# Patient Record
Sex: Female | Born: 1955 | Race: White | Hispanic: No | Smoking: Never smoker
Health system: Southern US, Community
[De-identification: ages and names within clinical notes are randomized; demographics above are authoritative.]

## PROBLEM LIST (undated history)

## (undated) DIAGNOSIS — M1990 Unspecified osteoarthritis, unspecified site: Secondary | ICD-10-CM

## (undated) DIAGNOSIS — E119 Type 2 diabetes mellitus without complications: Secondary | ICD-10-CM

---

## 1994-02-07 HISTORY — PX: BREAST BIOPSY: SHX20

## 2008-02-13 ENCOUNTER — Ambulatory Visit: Payer: Self-pay

## 2009-12-15 ENCOUNTER — Ambulatory Visit: Payer: Self-pay

## 2010-02-09 ENCOUNTER — Ambulatory Visit: Payer: Self-pay | Admitting: Nurse Practitioner

## 2010-03-02 ENCOUNTER — Ambulatory Visit: Payer: Self-pay | Admitting: Rheumatology

## 2010-03-10 ENCOUNTER — Ambulatory Visit: Payer: Self-pay | Admitting: Nurse Practitioner

## 2010-04-08 ENCOUNTER — Ambulatory Visit: Payer: Self-pay | Admitting: Nurse Practitioner

## 2011-01-10 ENCOUNTER — Ambulatory Visit: Payer: Self-pay

## 2012-01-30 ENCOUNTER — Ambulatory Visit: Payer: Self-pay

## 2013-01-30 ENCOUNTER — Ambulatory Visit: Payer: Self-pay

## 2014-02-14 ENCOUNTER — Ambulatory Visit: Payer: Self-pay

## 2014-03-04 ENCOUNTER — Ambulatory Visit: Payer: Self-pay

## 2015-01-27 ENCOUNTER — Other Ambulatory Visit: Payer: Self-pay | Admitting: Obstetrics and Gynecology

## 2015-01-27 DIAGNOSIS — Z1231 Encounter for screening mammogram for malignant neoplasm of breast: Secondary | ICD-10-CM

## 2015-03-06 ENCOUNTER — Ambulatory Visit
Admission: RE | Admit: 2015-03-06 | Discharge: 2015-03-06 | Disposition: A | Discharge status: Home / Self Care | Payer: BC Managed Care – PPO | Source: Ambulatory Visit | Attending: Obstetrics and Gynecology | Admitting: Obstetrics and Gynecology

## 2015-03-06 DIAGNOSIS — Z1231 Encounter for screening mammogram for malignant neoplasm of breast: Secondary | ICD-10-CM | POA: Insufficient documentation

## 2019-06-12 ENCOUNTER — Other Ambulatory Visit: Payer: Self-pay | Admitting: Family Medicine

## 2019-06-12 DIAGNOSIS — Z1231 Encounter for screening mammogram for malignant neoplasm of breast: Secondary | ICD-10-CM

## 2020-01-06 ENCOUNTER — Other Ambulatory Visit: Payer: Self-pay

## 2020-01-06 ENCOUNTER — Ambulatory Visit
Admission: RE | Admit: 2020-01-06 | Discharge: 2020-01-06 | Disposition: A | Discharge status: Home / Self Care | Payer: BC Managed Care – PPO | Source: Ambulatory Visit | Attending: Family Medicine | Admitting: Family Medicine

## 2020-01-06 DIAGNOSIS — Z1231 Encounter for screening mammogram for malignant neoplasm of breast: Secondary | ICD-10-CM | POA: Insufficient documentation

## 2020-02-27 ENCOUNTER — Other Ambulatory Visit: Payer: Self-pay

## 2020-02-27 ENCOUNTER — Encounter: Payer: Self-pay | Admitting: Emergency Medicine

## 2020-02-27 DIAGNOSIS — R55 Syncope and collapse: Secondary | ICD-10-CM | POA: Insufficient documentation

## 2020-02-27 DIAGNOSIS — E119 Type 2 diabetes mellitus without complications: Secondary | ICD-10-CM | POA: Diagnosis not present

## 2020-02-27 DIAGNOSIS — R519 Headache, unspecified: Secondary | ICD-10-CM | POA: Insufficient documentation

## 2020-02-27 LAB — CBC WITH DIFFERENTIAL/PLATELET
Abs Immature Granulocytes: 0.03 10*3/uL (ref 0.00–0.07)
Basophils Absolute: 0 10*3/uL (ref 0.0–0.1)
Basophils Relative: 0 %
Eosinophils Absolute: 0 10*3/uL (ref 0.0–0.5)
Eosinophils Relative: 1 %
HCT: 34.2 % — ABNORMAL LOW (ref 36.0–46.0)
Hemoglobin: 11.1 g/dL — ABNORMAL LOW (ref 12.0–15.0)
Immature Granulocytes: 0 %
Lymphocytes Relative: 8 %
Lymphs Abs: 0.7 10*3/uL (ref 0.7–4.0)
MCH: 33.3 pg (ref 26.0–34.0)
MCHC: 32.5 g/dL (ref 30.0–36.0)
MCV: 102.7 fL — ABNORMAL HIGH (ref 80.0–100.0)
Monocytes Absolute: 1 10*3/uL (ref 0.1–1.0)
Monocytes Relative: 11 %
Neutro Abs: 7 10*3/uL (ref 1.7–7.7)
Neutrophils Relative %: 80 %
Platelets: 150 10*3/uL (ref 150–400)
RBC: 3.33 MIL/uL — ABNORMAL LOW (ref 3.87–5.11)
RDW: 12 % (ref 11.5–15.5)
WBC: 8.8 10*3/uL (ref 4.0–10.5)
nRBC: 0 % (ref 0.0–0.2)

## 2020-02-27 NOTE — ED Triage Notes (Addendum)
EMS brings pt in from local business where she had syncopal episode x2; denies any precipitating factors; denies any recent illness; denies hx of same; denies any c/o at present

## 2020-02-28 ENCOUNTER — Emergency Department
Admission: EM | Admit: 2020-02-28 | Discharge: 2020-02-28 | Disposition: A | Discharge status: Home / Self Care | Payer: BC Managed Care – PPO | Attending: Emergency Medicine | Admitting: Emergency Medicine

## 2020-02-28 DIAGNOSIS — R55 Syncope and collapse: Secondary | ICD-10-CM

## 2020-02-28 HISTORY — DX: Type 2 diabetes mellitus without complications: E11.9

## 2020-02-28 HISTORY — DX: Unspecified osteoarthritis, unspecified site: M19.90

## 2020-02-28 LAB — COMPREHENSIVE METABOLIC PANEL
ALT: 16 U/L (ref 0–44)
AST: 22 U/L (ref 15–41)
Albumin: 3.7 g/dL (ref 3.5–5.0)
Alkaline Phosphatase: 54 U/L (ref 38–126)
Anion gap: 11 (ref 5–15)
BUN: 25 mg/dL — ABNORMAL HIGH (ref 8–23)
CO2: 25 mmol/L (ref 22–32)
Calcium: 8.8 mg/dL — ABNORMAL LOW (ref 8.9–10.3)
Chloride: 103 mmol/L (ref 98–111)
Creatinine, Ser: 1.09 mg/dL — ABNORMAL HIGH (ref 0.44–1.00)
GFR, Estimated: 57 mL/min — ABNORMAL LOW (ref >60)
Glucose, Bld: 119 mg/dL — ABNORMAL HIGH (ref 70–99)
Potassium: 3.2 mmol/L — ABNORMAL LOW (ref 3.5–5.1)
Sodium: 139 mmol/L (ref 135–145)
Total Bilirubin: 1.2 mg/dL (ref 0.3–1.2)
Total Protein: 7.2 g/dL (ref 6.5–8.1)

## 2020-02-28 LAB — TROPONIN I (HIGH SENSITIVITY)
Troponin I (High Sensitivity): 3 ng/L (ref <18)
Troponin I (High Sensitivity): 4 ng/L (ref <18)

## 2020-02-28 NOTE — Discharge Instructions (Addendum)
Return to the ER for new, worsening or recurrent passing out, lightheadedness, weakness, chest pain, severe headache, or any other new or worsening symptoms that concern you.

## 2020-02-28 NOTE — ED Provider Notes (Signed)
[facility] Emergency Department Provider Note ____________________________________________   Event Date/Time   First MD Initiated Contact with Patient 02/28/20 0725     (approximate)  I have reviewed the triage vital signs and the nursing notes.   HISTORY  Chief Complaint Loss of Consciousness    HPI Annette Cortez is a 65 y.o. female with PMH as noted below including diabetes on metformin who presents with a syncopal episode last night around 11 PM.  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  She states that she had not eaten anything all day, and in fact had not eaten anything since the day before.  She states that she felt hungry and a bit weak.  She started to have a mild headache.  She then lost consciousness.  Since she has regained consciousness in the hospital waiting overnight, she has not had any further symptoms.  She states she is still hungry but otherwise feels fine.  She denies any recent prior similar episodes.    Past Medical History:  Diagnosis Date  . Arthritis   . Diabetes mellitus without complication (HCC)     There are no problems to display for this patient.   Past Surgical History:  Procedure Laterality Date  . BREAST BIOPSY Right 1996   Negative    Prior to Admission medications   Not on File    Allergies Patient has no known allergies.  Family History  Problem Relation Age of Onset  . Breast cancer Paternal Aunt 62  . Breast cancer Paternal Grandmother 45    Social History Social History   Tobacco Use  . Smoking status: Never Smoker  . Smokeless tobacco: Never Used  Vaping Use  . Vaping Use: Never used  Substance Use Topics  . Alcohol use: Never  . Drug use: Never    Review of Systems  Constitutional: No fever/chills. Eyes: No visual changes. ENT: No sore throat. Cardiovascular: Denies chest pain. Respiratory: Denies shortness of  breath. Gastrointestinal: No vomiting or diarrhea.  Genitourinary: Negative for dysuria.  Musculoskeletal: Negative for back pain. Skin: Negative for rash. Neurological: Negative for headache.   ____________________________________________   PHYSICAL EXAM:  VITAL SIGNS: ED Triage Vitals  Enc Vitals Group     BP 02/27/20 2337 (!) 114/46     Pulse Rate 02/27/20 2337 72     Resp 02/27/20 2337 18     Temp 02/27/20 2337 97.8 F (36.6 C)     Temp Source 02/27/20 2337 Oral     SpO2 02/27/20 2337 95 %     Weight 02/27/20 2337 185 lb (83.9 kg)     Height 02/27/20 2337 5' 6.5" (1.689 m)     Head Circumference --      Peak Flow --      Pain Score 02/27/20 2343 0     Pain Loc --      Pain Edu? --      Excl. in GC? --     Constitutional: Alert and oriented. Well appearing and in no acute distress. Eyes: Conjunctivae are normal.  Head: Atraumatic. Nose: No congestion/rhinnorhea. Mouth/Throat: Mucous membranes are slightly dry.   Neck: Normal range of motion.  Cardiovascular: Normal rate, regular rhythm. Grossly normal heart sounds.  Good peripheral circulation. Respiratory: Normal respiratory effort.  No retractions. Lungs CTAB. Gastrointestinal: No distention.  Musculoskeletal: Extremities warm and well perfused.  Neurologic:  Normal speech and language. No gross focal  neurologic deficits are appreciated.  Skin:  Skin is warm and dry. No rash noted. Psychiatric: Mood and affect are normal. Speech and behavior are normal.  ____________________________________________   LABS (all labs ordered are listed, but only abnormal results are displayed)  Labs Reviewed  CBC WITH DIFFERENTIAL/PLATELET - Abnormal; Notable for the following components:      Result Value   RBC 3.33 (*)    Hemoglobin 11.1 (*)    HCT 34.2 (*)    MCV 102.7 (*)    All other components within normal limits  COMPREHENSIVE METABOLIC PANEL - Abnormal; Notable for the following components:   Potassium 3.2 (*)     Glucose, Bld 119 (*)    BUN 25 (*)    Creatinine, Ser 1.09 (*)    Calcium 8.8 (*)    GFR, Estimated 57 (*)    All other components within normal limits  TROPONIN I (HIGH SENSITIVITY)  TROPONIN I (HIGH SENSITIVITY)   ____________________________________________  EKG  ED ECG REPORT I, Dionne Bucy, the attending physician, personally viewed and interpreted this ECG.  Date: 02/28/2020 EKG Time: 2346 Rate: 68 Rhythm: normal sinus rhythm QRS Axis: normal Intervals: normal ST/T Wave abnormalities: normal Narrative Interpretation: no evidence of acute ischemia  ____________________________________________  RADIOLOGY    ____________________________________________   PROCEDURES  Procedure(s) performed: No  Procedures  Critical Care performed: No ____________________________________________   INITIAL IMPRESSION / ASSESSMENT AND PLAN / ED COURSE  Pertinent labs & imaging results that were available during my care of the patient were reviewed by me and considered in my medical decision making (see chart for details).  65 year old female with diabetes on metformin presents with an apparent syncopal episode last night after not having eaten all day.  The patient had a mild prodrome and has had no significant symptoms after the event.  The patient reports that EMS checked her glucose and it was around 100.  Of note, the patient had to wait in the waiting room for almost 8 hours prior to being placed in exam room and seen.  She had no recurrent symptoms during this time period.  Lab work-up including troponins x2 was negative for acute findings.  Overall presentation is consistent with vasovagal near syncope versus a possible episode of hypoglycemia especially given that the patient had not eaten since the day prior.  There is no evidence of arrhythmia or other cardiac cause, PE, acute stroke, or other concerning etiology of this episode.  Given that she has now been  in the ED for 8 hours with no recurrent symptoms, stable vital signs, and a reassuring work-up, there is no indication for further ED work-up or admission.  The patient herself would like to go home.  I counseled her on the results of the work-up.  Return precautions given, and she expresses understanding.  ____________________________________________   FINAL CLINICAL IMPRESSION(S) / ED DIAGNOSES  Final diagnoses:  Syncope, unspecified syncope type      NEW MEDICATIONS STARTED DURING THIS VISIT:  There are no discharge medications for this patient.    Note:  This document was prepared using Dragon voice recognition software and may include unintentional dictation errors.   Dionne Bucy, MD 02/28/20 1511

## 2021-12-10 IMAGING — MG DIGITAL SCREENING BILAT W/ TOMO W/ CAD
8 series · 8 of 24 positions shown · non-contrast
Comparison: Previous exam(s).

CLINICAL DATA: Screening.

EXAM:
DIGITAL SCREENING BILATERAL MAMMOGRAM WITH TOMO AND CAD

[R MLO synth-2D]
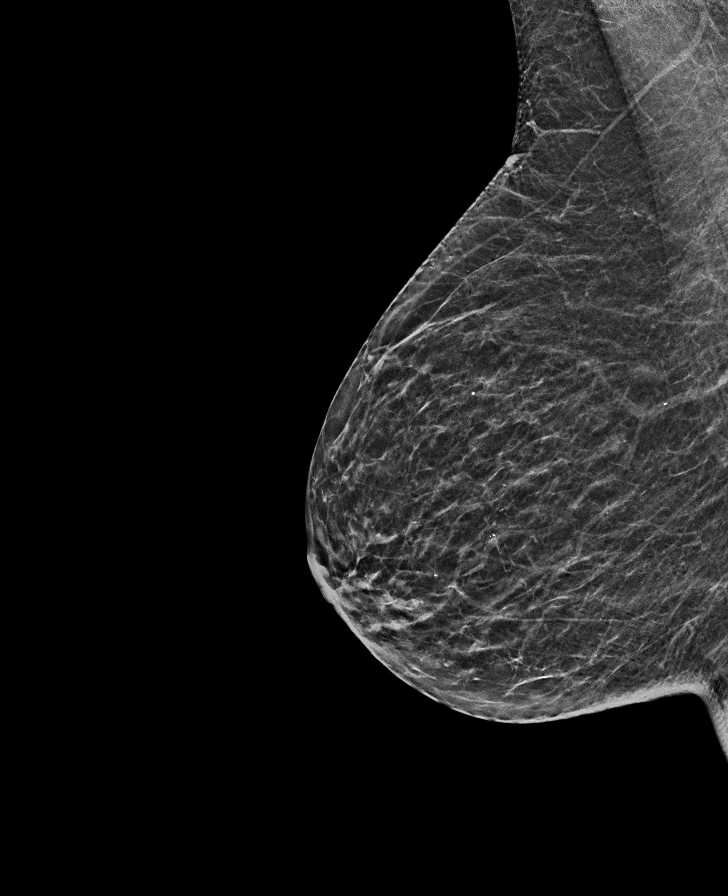

[L CC synth-2D]
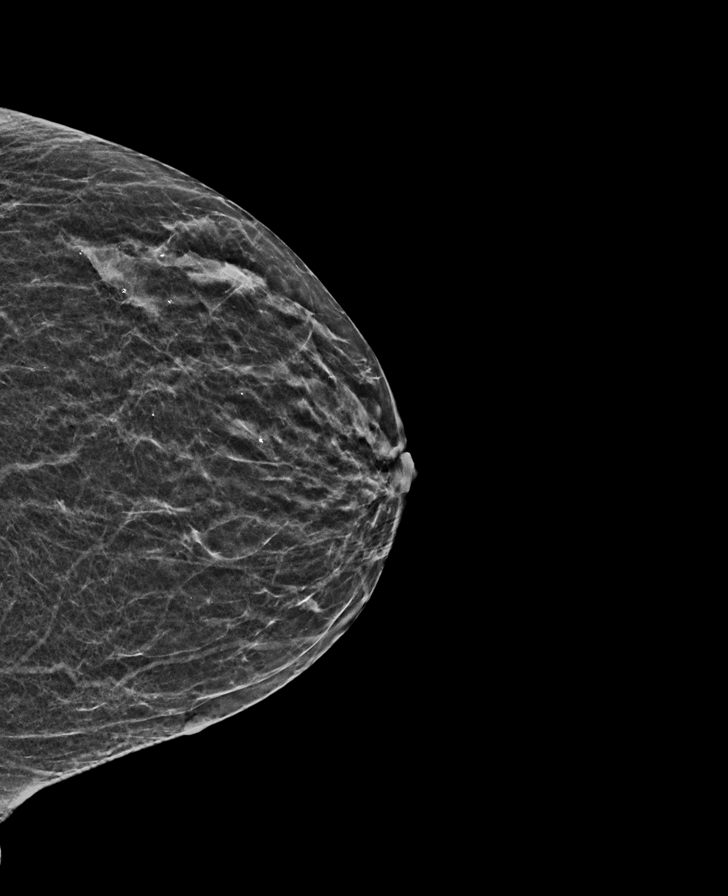

[R CC synth-2D]
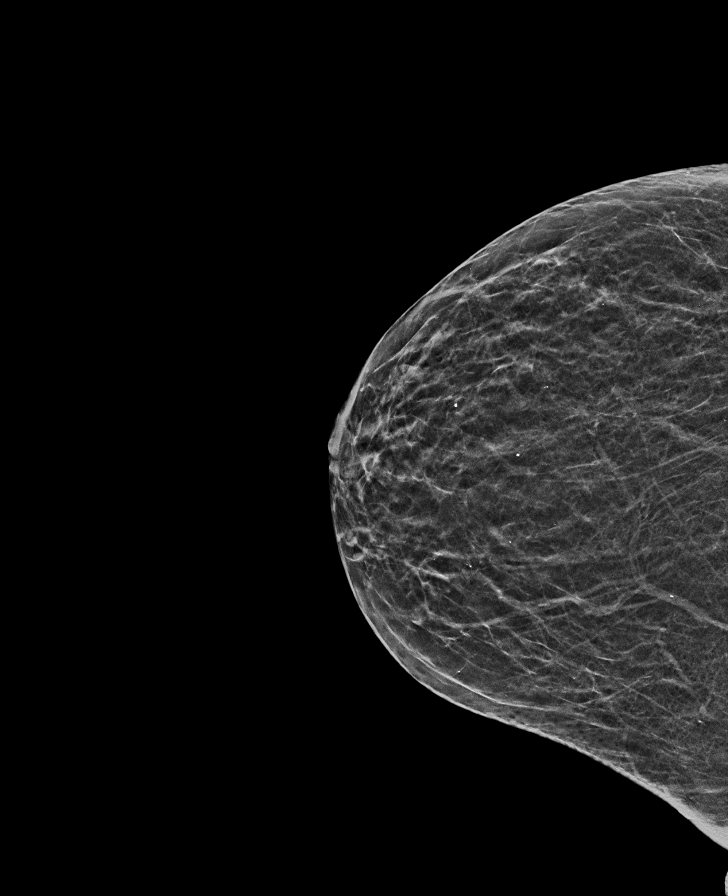

[L MLO synth-2D]
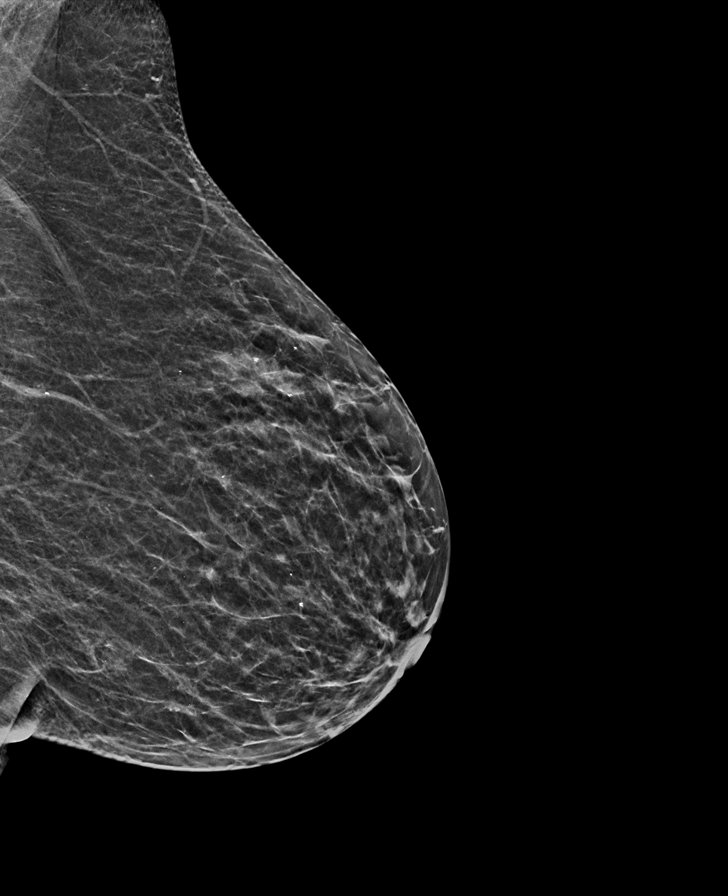

[R CC tomo · tomo slice 21/42.0]
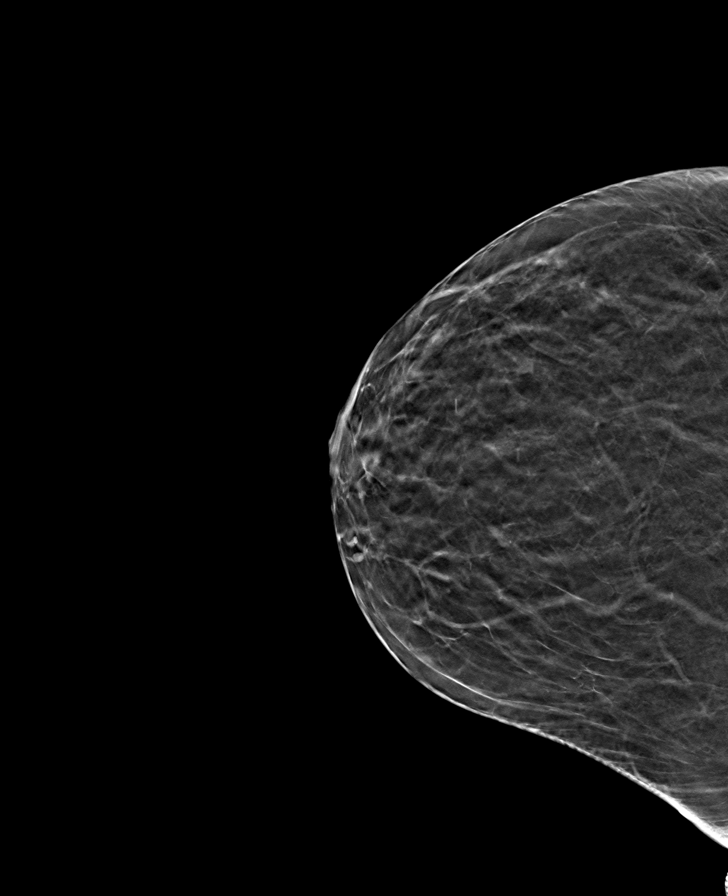

[R MLO tomo · tomo slice 24/47.0]
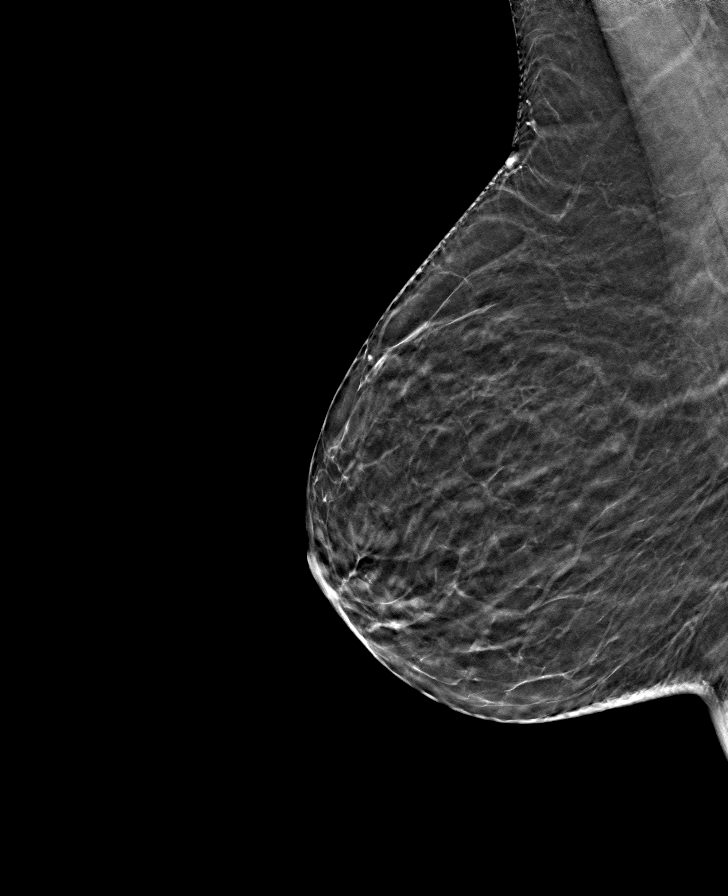

[L CC tomo · tomo slice 21/41.0]
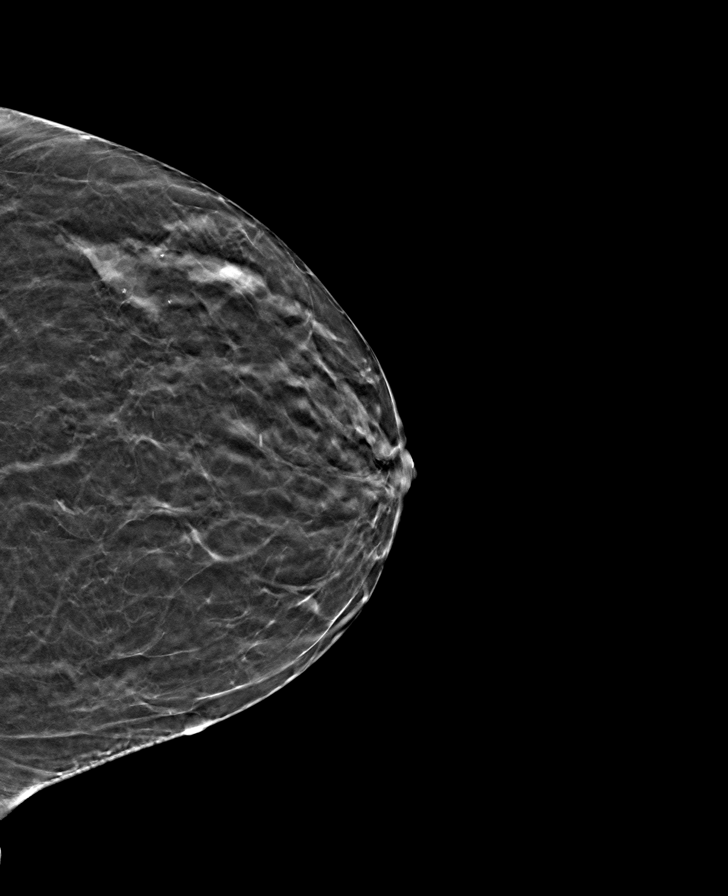

[L MLO tomo · tomo slice 23/46.0]
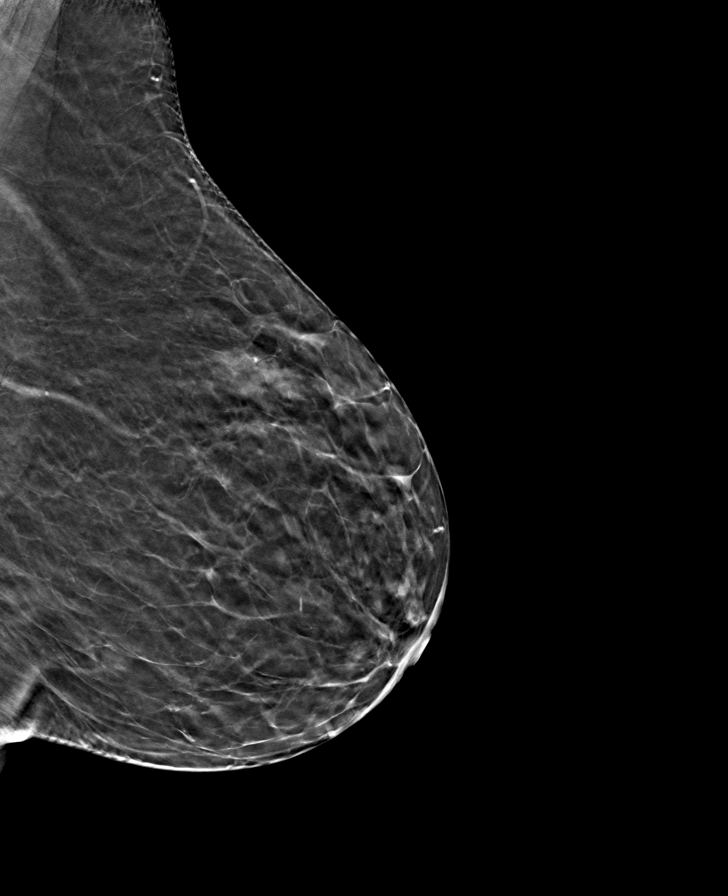

[8 of 24 positions shown; findings below may reference images not displayed]

ACR Breast Density Category b: There are scattered areas of
fibroglandular density.
FINDINGS: There are no findings suspicious for malignancy. Images were
processed with CAD.
IMPRESSION: No mammographic evidence of malignancy. A result letter of this
screening mammogram will be mailed directly to the patient.

RECOMMENDATION:
Screening mammogram in one year. (Code:[id number])

BI-RADS CATEGORY  1: Negative.
